# Patient Record
Sex: Male | Born: 1980 | Race: Black or African American | Hispanic: No | Marital: Married | Smoking: Current every day smoker
Health system: Southern US, Community
[De-identification: ages and names within clinical notes are randomized; demographics above are authoritative.]

---

## 2014-05-05 ENCOUNTER — Emergency Department (HOSPITAL_COMMUNITY)
Admission: EM | Admit: 2014-05-05 | Discharge: 2014-05-05 | Disposition: A | Discharge status: Home / Self Care | Payer: Commercial Managed Care - PPO | Attending: Emergency Medicine | Admitting: Emergency Medicine

## 2014-05-05 ENCOUNTER — Encounter (HOSPITAL_COMMUNITY): Payer: Self-pay | Admitting: Emergency Medicine

## 2014-05-05 DIAGNOSIS — K0520 Aggressive periodontitis, unspecified: Secondary | ICD-10-CM | POA: Insufficient documentation

## 2014-05-05 DIAGNOSIS — K0889 Other specified disorders of teeth and supporting structures: Secondary | ICD-10-CM

## 2014-05-05 DIAGNOSIS — K089 Disorder of teeth and supporting structures, unspecified: Secondary | ICD-10-CM | POA: Insufficient documentation

## 2014-05-05 DIAGNOSIS — F17200 Nicotine dependence, unspecified, uncomplicated: Secondary | ICD-10-CM | POA: Insufficient documentation

## 2014-05-05 MED ORDER — OXYCODONE-ACETAMINOPHEN 5-325 MG PO TABS
2.0000 | ORAL_TABLET | Freq: Once | ORAL | Status: AC
  Administered 2014-05-05: 2 via ORAL
  Filled 2014-05-05: qty 2

## 2014-05-05 MED ORDER — OXYCODONE-ACETAMINOPHEN 5-325 MG PO TABS
1.0000 | ORAL_TABLET | Freq: Four times a day (QID) | ORAL | Status: AC | PRN
Start: 2014-05-05 — End: ?

## 2014-05-05 MED ORDER — HYDROMORPHONE HCL PF 1 MG/ML IJ SOLN
1.0000 mg | Freq: Once | INTRAMUSCULAR | Status: AC
  Administered 2014-05-05: 1 mg via INTRAMUSCULAR
  Filled 2014-05-05: qty 1

## 2014-05-05 MED ORDER — PENICILLIN V POTASSIUM 500 MG PO TABS: 500.0000 mg | ORAL_TABLET | Freq: Four times a day (QID) | ORAL | Status: AC

## 2014-05-05 NOTE — ED Notes (Signed)
Patient is alert and oriented x3.  He is complaining of dental pain on the top left side of his mouth.   Currently he rates his pain 10 of 10.

## 2014-05-05 NOTE — Discharge Instructions (Signed)

## 2014-05-05 NOTE — ED Provider Notes (Signed)
CSN: [id number]     Arrival date & time 05/05/14  2001 History   First MD Initiated Contact with Patient 05/05/14 2024     Chief Complaint  Patient presents with  . Dental Pain     (Consider location/radiation/quality/duration/timing/severity/associated sxs/prior Treatment) Patient is a 33 y.o. male presenting with tooth pain. The history is provided by the patient.  Dental Pain Location:  Upper Upper teeth location:  12/LU 1st bicuspid Quality:  Aching and dull Severity:  Moderate Onset quality:  Gradual Duration:  3 days Timing:  Constant Progression:  Worsening Chronicity:  Recurrent Context comment:  New fracture Relieved by:  Nothing Worsened by:  Touching Ineffective treatments:  NSAIDs Associated symptoms: no drooling, no fever, no headaches and no neck pain     History reviewed. No pertinent past medical history. History reviewed. No pertinent past surgical history. History reviewed. No pertinent family history. History  Substance Use Topics  . Smoking status: Current Every Day Smoker  . Smokeless tobacco: Not on file  . Alcohol Use: Yes    Review of Systems  Constitutional: Negative for fever.  HENT: Negative for drooling and rhinorrhea.   Eyes: Negative for pain.  Respiratory: Negative for cough and shortness of breath.   Cardiovascular: Negative for chest pain and leg swelling.  Gastrointestinal: Negative for nausea, vomiting, abdominal pain and diarrhea.  Genitourinary: Negative for dysuria and hematuria.  Musculoskeletal: Negative for gait problem and neck pain.  Skin: Negative for color change.  Neurological: Negative for numbness and headaches.  Hematological: Negative for adenopathy.  Psychiatric/Behavioral: Negative for behavioral problems.  All other systems reviewed and are negative.     Allergies  Review of patient's allergies indicates no known allergies.  Home Medications   Prior to Admission medications   Medication Sig Start Date  End Date Taking? Authorizing Provider  acetaminophen (TYLENOL) 500 MG chewable tablet Chew 1,500 mg by mouth every 6 (six) hours as needed for pain.   Yes Historical Provider, MD   BP 125/78  Pulse 81  Temp(Src) 98 F (36.7 C) (Oral)  Resp 16  SpO2 100% Physical Exam  Nursing note and vitals reviewed. Constitutional: He is oriented to person, place, and time. He appears well-developed and well-nourished.  HENT:  Head: Normocephalic and atraumatic.  Right Ear: External ear normal.  Left Ear: External ear normal.  Nose: Nose normal.  Mouth/Throat: Oropharynx is clear and moist. No oropharyngeal exudate.  Chronic appearing Rennis Harding type III fracture to the left upper first premolar. This tooth is mildly tender to palpation.  There is a 2 x 2 centimeter area of swelling on the roof of the mouth proximal to this tooth. This area is fluctuant.  Eyes: Conjunctivae and EOM are normal. Pupils are equal, round, and reactive to light.  Neck: Normal range of motion. Neck supple.  Cardiovascular: Normal rate, regular rhythm, normal heart sounds and intact distal pulses.  Exam reveals no gallop and no friction rub.   No murmur heard. Pulmonary/Chest: Effort normal and breath sounds normal. No respiratory distress. He has no wheezes.  Abdominal: Soft. Bowel sounds are normal. He exhibits no distension. There is no tenderness. There is no rebound and no guarding.  Musculoskeletal: Normal range of motion. He exhibits no edema and no tenderness.  Neurological: He is alert and oriented to person, place, and time.  Skin: Skin is warm and dry.  Psychiatric: He has a normal mood and affect. His behavior is normal.    ED Course  INCISION AND  DRAINAGE Date/Time: 05/05/2014 11:23 PM Performed by: Purvis SheffieldHARRISON, Tesha Archambeau, S Authorized by: Purvis SheffieldHARRISON, Chelsi Warr, S Consent: Verbal consent obtained. Risks and benefits: risks, benefits and alternatives were discussed Consent given by: patient Patient understanding:  patient states understanding of the procedure being performed Required items: required blood products, implants, devices, and special equipment available Patient identity confirmed: verbally with patient, arm band, provided demographic data and hospital-assigned identification number Time out: Immediately prior to procedure a "time out" was called to verify the correct patient, procedure, equipment, support staff and site/side marked as required. Type: abscess Location: gum tissue of hard palate. Anesthesia: local infiltration Local anesthetic: lidocaine 1% with epinephrine Patient sedated: no Scalpel size: 11 Incision type: single straight Complexity: simple Drainage: bloody Drainage amount: scant Wound treatment: wound left open Patient tolerance: Patient tolerated the procedure well with no immediate complications. Comments: Very small 592mm-3mm incision made in the area of fluctuance on the hard palate. Only small amount of bloody drainage.    (including critical care time) Labs Review Labs Reviewed - No data to display  Imaging Review No results found.   EKG Interpretation None      MDM   Final diagnoses:  Pain, dental    10:20 PM 33 y.o. male who presents with dental pain which began several days ago. He notes the left upper first premolar has a chronic fracture which he believes fractured again. He denies any fevers. He is afebrile and vital signs are unremarkable here. Performed I/D of small appearing area of fluctuance on the hard palate proximal to the tooth.    11:25 PM:  I have discussed the diagnosis/risks/treatment options with the patient and believe the pt to be eligible for discharge home to follow-up with his dentist next Tuesday as scheduled. We also discussed returning to the ED immediately if new or worsening sx occur. We discussed the sx which are most concerning (e.g., worsening pain, fever, inc swelling) that necessitate immediate return. Medications  administered to the patient during their visit and any new prescriptions provided to the patient are listed below.  Medications given during this visit Medications  oxyCODONE-acetaminophen (PERCOCET/ROXICET) 5-325 MG per tablet 2 tablet (2 tablets Oral Given 05/05/14 2108)  HYDROmorphone (DILAUDID) injection 1 mg (1 mg Intramuscular Given 05/05/14 2237)    Discharge Medication List as of 05/05/2014 10:15 PM    START taking these medications   Details  oxyCODONE-acetaminophen (PERCOCET) 5-325 MG per tablet Take 1-2 tablets by mouth every 6 (six) hours as needed for moderate pain., Starting 05/05/2014, Until Discontinued, Print    penicillin v potassium (VEETID) 500 MG tablet Take 1 tablet (500 mg total) by mouth 4 (four) times daily., Starting 05/05/2014, Last dose on Sat 05/12/14, Print           Junius ArgyleForrest S Dawnisha Marquina, MD 05/05/14 2328

## 2019-12-19 ENCOUNTER — Encounter (HOSPITAL_COMMUNITY): Payer: Self-pay | Admitting: *Deleted

## 2019-12-19 ENCOUNTER — Emergency Department (HOSPITAL_COMMUNITY): Payer: PRIVATE HEALTH INSURANCE

## 2019-12-19 ENCOUNTER — Other Ambulatory Visit: Payer: Self-pay

## 2019-12-19 ENCOUNTER — Emergency Department (HOSPITAL_COMMUNITY)
Admission: EM | Admit: 2019-12-19 | Discharge: 2019-12-19 | Disposition: A | Discharge status: Home / Self Care | Payer: PRIVATE HEALTH INSURANCE | Attending: Emergency Medicine | Admitting: Emergency Medicine

## 2019-12-19 DIAGNOSIS — X509XXA Other and unspecified overexertion or strenuous movements or postures, initial encounter: Secondary | ICD-10-CM | POA: Insufficient documentation

## 2019-12-19 DIAGNOSIS — F17200 Nicotine dependence, unspecified, uncomplicated: Secondary | ICD-10-CM | POA: Insufficient documentation

## 2019-12-19 DIAGNOSIS — Y999 Unspecified external cause status: Secondary | ICD-10-CM | POA: Insufficient documentation

## 2019-12-19 DIAGNOSIS — Y929 Unspecified place or not applicable: Secondary | ICD-10-CM | POA: Insufficient documentation

## 2019-12-19 DIAGNOSIS — S82892A Other fracture of left lower leg, initial encounter for closed fracture: Secondary | ICD-10-CM | POA: Insufficient documentation

## 2019-12-19 DIAGNOSIS — Y9301 Activity, walking, marching and hiking: Secondary | ICD-10-CM | POA: Insufficient documentation

## 2019-12-19 MED ORDER — NAPROXEN 375 MG PO TABS: 375.0000 mg | ORAL_TABLET | Freq: Two times a day (BID) | ORAL | 0 refills | Status: AC

## 2019-12-19 NOTE — ED Triage Notes (Signed)
Pt reports falling down steps several days ago and now has left ankle pain. No acute distress is noted.

## 2019-12-19 NOTE — Discharge Instructions (Addendum)
We have placed your left ankle on a splint, please keep this on until you have orthopedic follow-up.  I have also prescribed a short course of anti-inflammatories please take these in order to help with swelling to your left ankle.

## 2019-12-19 NOTE — Progress Notes (Signed)
Orthopedic Tech Progress Note Patient Details:  Joshua Bryant May 19, 1981 [id number]  Ortho Devices Type of Ortho Device: Crutches, Stirrup splint Ortho Device/Splint Location: lle Ortho Device/Splint Interventions: Ordered, Application, Adjustment   Post Interventions Patient Tolerated: Well Instructions Provided: Care of device, Adjustment of device   [name] 12/19/2019, 11:58 AM

## 2019-12-19 NOTE — ED Provider Notes (Signed)
[facility] EMERGENCY DEPARTMENT Provider Note   CSN: [id number] Arrival date & time: 12/19/19  3875     History Chief Complaint  Patient presents with  . Ankle Pain    Joshua Bryant is a 39 y.o. male.  39 y.o male with no PM presents to the ED with a chief complaint of left ankle pain x 8 days. Patient reports walking down the step when he felt his left foot invert while wearing thick work boots. He reports pain along the medial aspect, states the pain is worse with ambulation and at night. He has tried elevation along with ICE but has seen no improvement. He denies any other injury, fever, other complaints.   The history is provided by the patient.       History reviewed. No pertinent past medical history.  There are no problems to display for this patient.   History reviewed. No pertinent surgical history.     History reviewed. No pertinent family history.  Social History   Tobacco Use  . Smoking status: Current Every Day Smoker  Substance Use Topics  . Alcohol use: Yes  . Drug use: Not on file    Home Medications Prior to Admission medications   Medication Sig Start Date End Date Taking? Authorizing Provider  acetaminophen (TYLENOL) 500 MG chewable tablet Chew 1,500 mg by mouth every 6 (six) hours as needed for pain.    [provider]  naproxen (NAPROSYN) 375 MG tablet Take 1 tablet (375 mg total) by mouth 2 (two) times daily for 7 days. 12/19/19 12/26/19  Claude Manges, PA-C  oxyCODONE-acetaminophen (PERCOCET) 5-325 MG per tablet Take 1-2 tablets by mouth every 6 (six) hours as needed for moderate pain. 05/05/14   Purvis Sheffield, MD    Allergies    Patient has no known allergies.  Review of Systems   Review of Systems  Constitutional: Negative for fever.  Musculoskeletal: Positive for arthralgias.    Physical Exam Updated Vital Signs BP 124/73 (BP Location: Right Arm)   Pulse 89   Temp 98.7 F (37.1 C) (Oral)   Resp 14    SpO2 98%   Physical Exam Vitals and nursing note reviewed.  Constitutional:      Appearance: He is well-developed.  HENT:     Head: Normocephalic and atraumatic.  Eyes:     General: No scleral icterus.    Pupils: Pupils are equal, round, and reactive to light.  Cardiovascular:     Heart sounds: Normal heart sounds.  Pulmonary:     Effort: Pulmonary effort is normal.     Breath sounds: Normal breath sounds. No wheezing.  Chest:     Chest wall: No tenderness.  Abdominal:     General: Bowel sounds are normal. There is no distension.     Palpations: Abdomen is soft.     Tenderness: There is no abdominal tenderness.  Musculoskeletal:        General: No deformity.     Cervical back: Normal range of motion.     Left ankle: Swelling present. No deformity, ecchymosis or lacerations. Tenderness present over the medial malleolus. Normal range of motion.  Skin:    General: Skin is warm and dry.  Neurological:     Mental Status: He is alert and oriented to person, place, and time.     ED Results / Procedures / Treatments   Labs (all labs ordered are listed, but only abnormal results are displayed) Labs Reviewed - No data to  display  EKG None  Radiology DG Ankle Complete Left  Result Date: 12/19/2019 CLINICAL DATA:  Twisted left ankle.  Pain. EXAM: LEFT ANKLE COMPLETE - 3+ VIEW COMPARISON:  No prior. FINDINGS: Diffuse soft tissue swelling, particularly over the medial malleolus. Nondisplaced fracture of the medial malleolus noted. Lateral malleolus is intact. Subtle lucency is noted in the distal left fibular shaft. Subtle cortical erosion of the anterior aspect of this lesion cannot be completely excluded. Although this could represent a benign lesion, a malignant lesion cannot be completely excluded. MRI of the left ankle/distal left tib fib suggested for further evaluation. IMPRESSION: 1. Diffuse soft tissue swelling, particularly over the medial malleolus. Nondisplaced fracture of  the medial malleolus. Lateral malleolus is intact. 2. Subtle lucency noted in the distal left fibular shaft. Subtle cortical erosion of the anterior aspect of this lesion cannot be completely excluded. Although this could represent a benign lesion, a malignant lesion cannot be completely excluded. MRI of the left ankle/distal left tib-fib suggested for further evaluation Electronically Signed   By: Marcello Moores  Register   On: 12/19/2019 07:47    Procedures Procedures (including critical care time)  Medications Ordered in ED Medications - No data to display  ED Course  I have reviewed the triage vital signs and the nursing notes.  Pertinent labs & imaging results that were available during my care of the patient were reviewed by me and considered in my medical decision making (see chart for details).    MDM Rules/Calculators/A&P  Patient with no pertinent past medical history presents the ED with complaints of left ankle pain after a fall approximately 12 days ago.  He reports pain along the medial aspect, this is worse with ambulation along with palpation of the area.  He has been applying ice without improvement in his symptoms.  X-ray of his left ankle did reveal a nondisplaced acute fracture of the distal medial malleolus.  I have discussed these results with patient at length.  A call was placed for Ortho, Hilbert Odor, recommendations for a steroid Lint were made.  We will also provide patient with crutches in order to further mobilize joint.  Essential finding of lucency to the fibular region of the left leg was seen, these results were discussed with patient, he knows he will ultimately need evaluation with an MRI of his left leg.  Patient was placed on steroid splint, he was also provided with crutches for ambulation.  He is aware he will need Ortho follow-up, Dr. Victorino December number is attached to the chart.  Patient understands and agrees to management, return precautions discussed  at length.    Portions of this note were generated with [employer]. Dictation errors may occur despite best attempts at proofreading.  Final Clinical Impression(s) / ED Diagnoses Final diagnoses:  Closed fracture of left ankle, initial encounter    Rx / DC Orders ED Discharge Orders         Ordered    naproxen (NAPROSYN) 375 MG tablet  2 times daily     12/19/19 1136           Janeece Fitting, PA-C 12/19/19 1228    Varney Biles, MD 12/19/19 1524

## 2020-09-03 IMAGING — CR DG ANKLE COMPLETE 3+V*L*
3 series · 3 of 3 positions shown · non-contrast
Comparison: No prior.

CLINICAL DATA: Twisted left ankle.  Pain.

EXAM:
LEFT ANKLE COMPLETE - 3+ VIEW

[ankle ap]
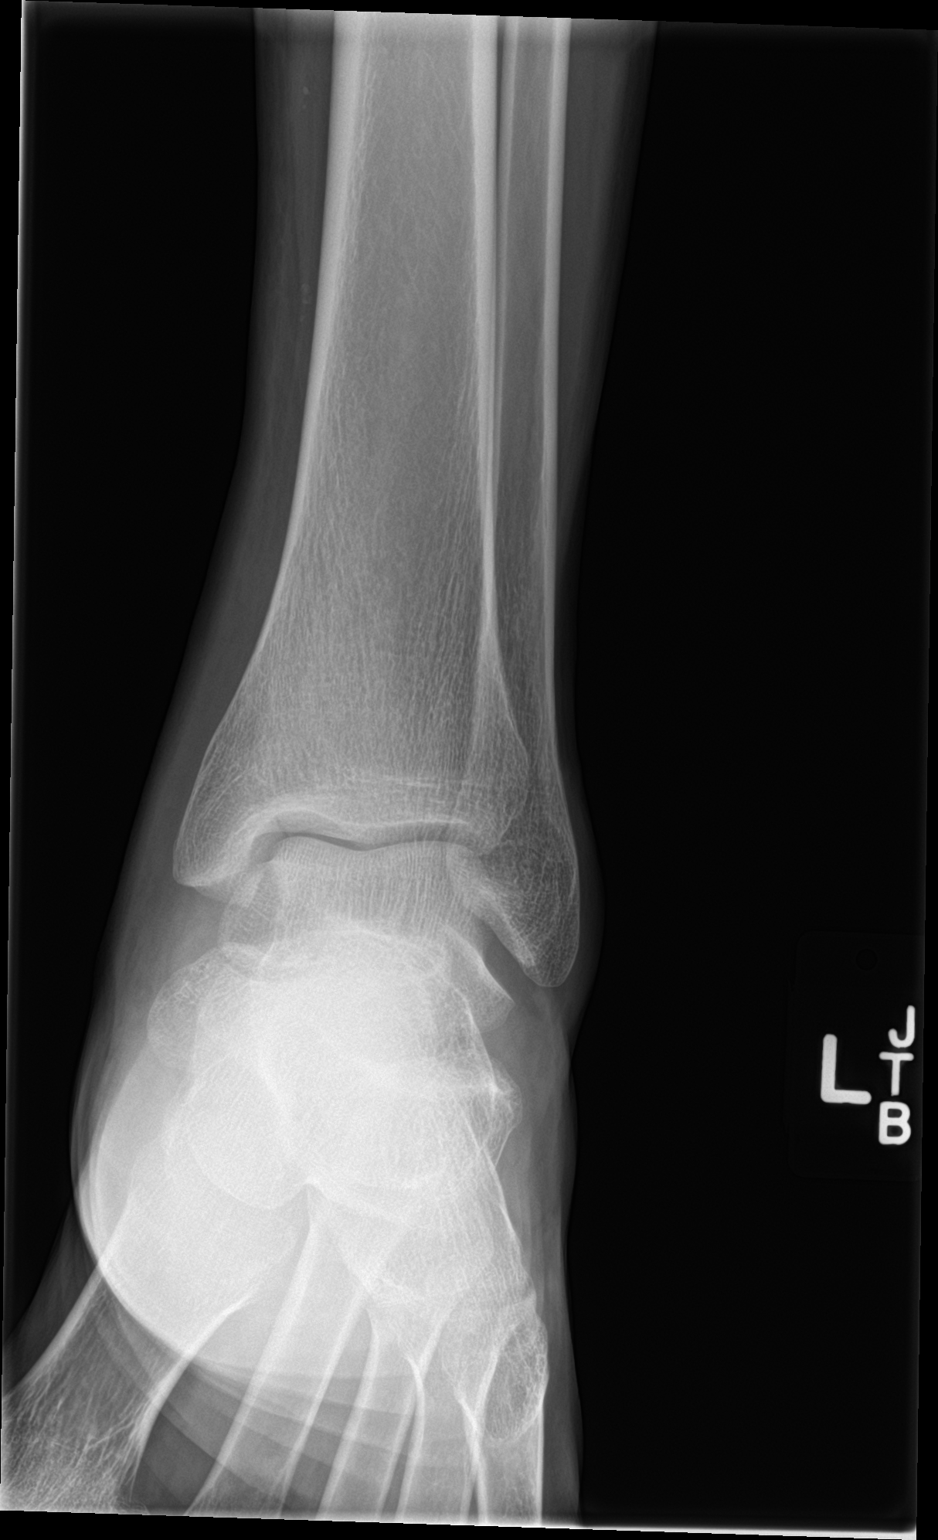

[ankle obl]
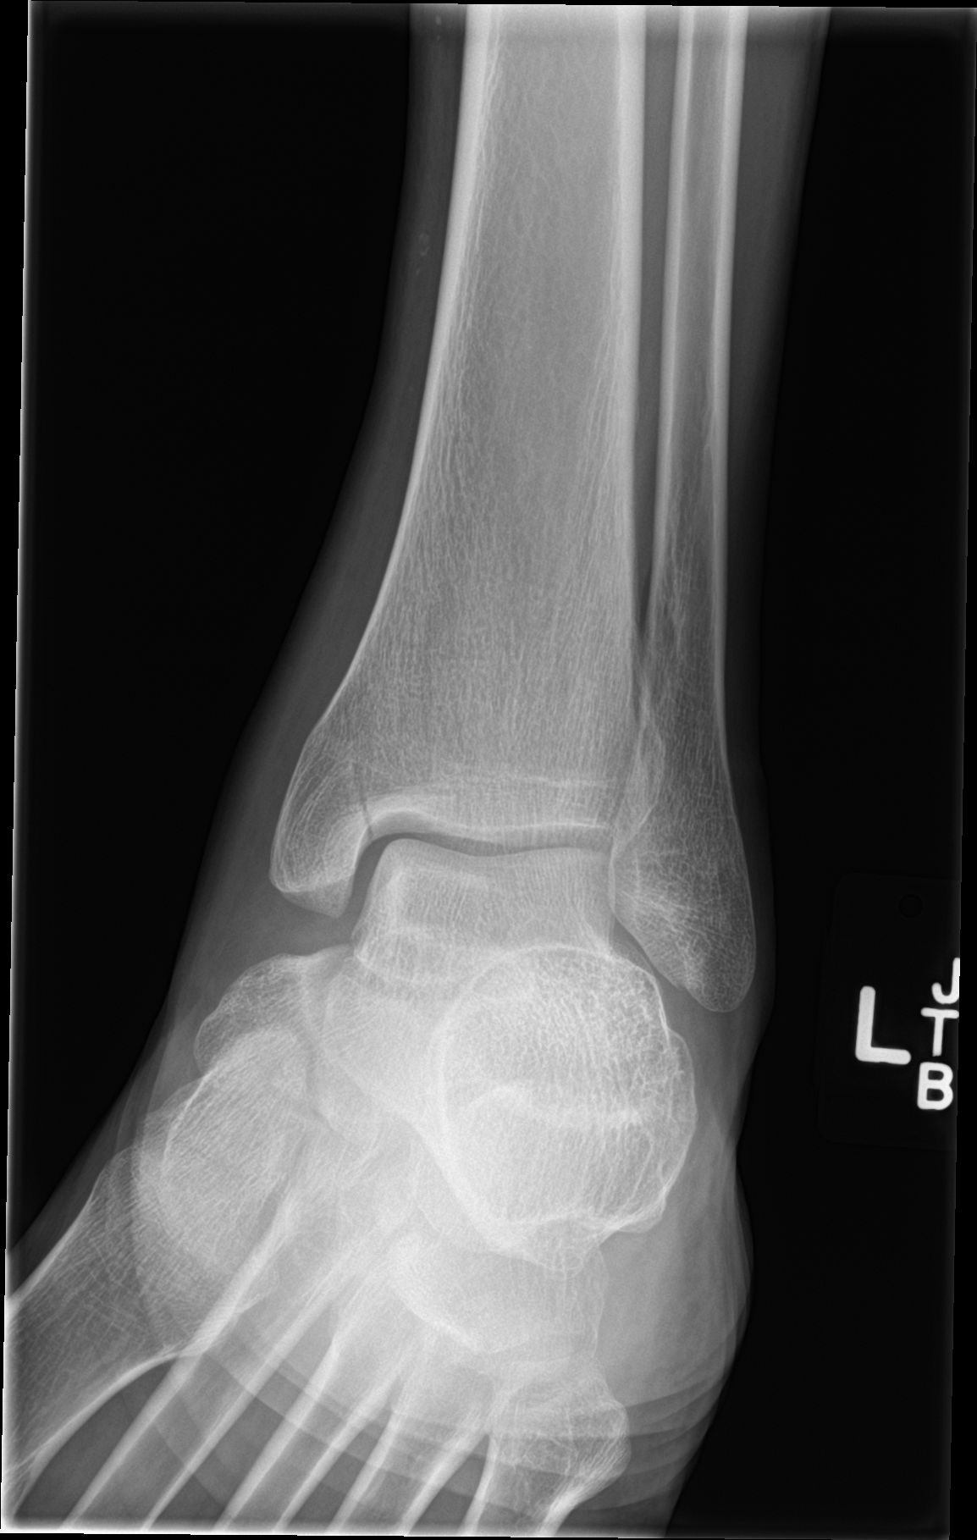

[ankle lat]
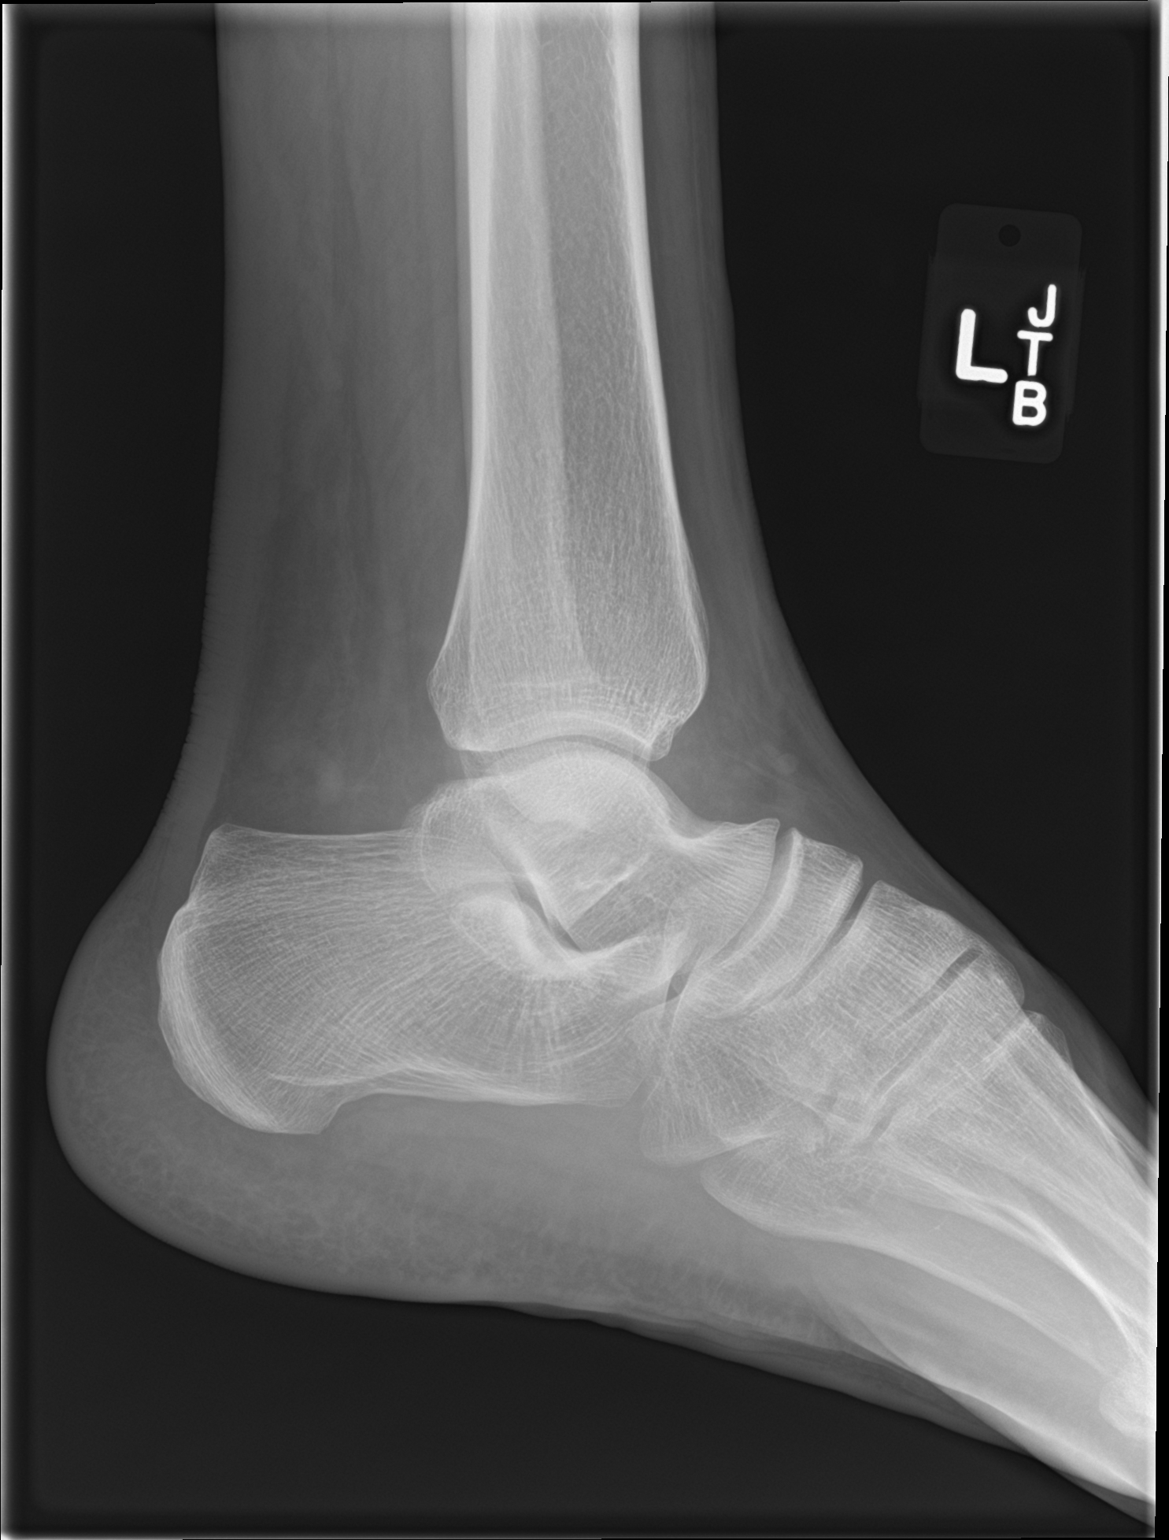

[3 of 3 positions shown; findings below may reference images not displayed]

FINDINGS: Diffuse soft tissue swelling, particularly over the medial
malleolus. Nondisplaced fracture of the medial malleolus noted.
Lateral malleolus is intact. Subtle lucency is noted in the distal
left fibular shaft. Subtle cortical erosion of the anterior aspect
of this lesion cannot be completely excluded. Although this could
represent a benign lesion, a malignant lesion cannot be completely
excluded. MRI of the left ankle/distal left tib fib suggested for
further evaluation.
IMPRESSION: 1. Diffuse soft tissue swelling, particularly over the medial
malleolus. Nondisplaced fracture of the medial malleolus. Lateral
malleolus is intact.

2. Subtle lucency noted in the distal left fibular shaft. Subtle
cortical erosion of the anterior aspect of this lesion cannot be
completely excluded. Although this could represent a benign lesion,
a malignant lesion cannot be completely excluded. MRI of the left
ankle/distal left tib-fib suggested for further evaluation
